# Patient Record
Sex: Male | Born: 1965
Health system: Southern US, Community
[De-identification: ages and names within clinical notes are randomized; demographics above are authoritative.]

## PROBLEM LIST (undated history)

## (undated) DIAGNOSIS — E785 Hyperlipidemia, unspecified: Secondary | ICD-10-CM

## (undated) DIAGNOSIS — F419 Anxiety disorder, unspecified: Secondary | ICD-10-CM

## (undated) DIAGNOSIS — T7840XA Allergy, unspecified, initial encounter: Secondary | ICD-10-CM

## (undated) HISTORY — PX: POLYPECTOMY: SHX149

## (undated) HISTORY — DX: Anxiety disorder, unspecified: F41.9

## (undated) HISTORY — PX: CHOLECYSTECTOMY: SHX55

## (undated) HISTORY — PX: COLONOSCOPY: SHX174

## (undated) HISTORY — DX: Hyperlipidemia, unspecified: E78.5

## (undated) HISTORY — DX: Allergy, unspecified, initial encounter: T78.40XA

---

## 2016-02-11 DIAGNOSIS — K080 Exfoliation of teeth due to systemic causes: Secondary | ICD-10-CM | POA: Diagnosis not present

## 2016-05-15 DIAGNOSIS — Z6829 Body mass index (BMI) 29.0-29.9, adult: Secondary | ICD-10-CM | POA: Diagnosis not present

## 2016-05-15 DIAGNOSIS — F419 Anxiety disorder, unspecified: Secondary | ICD-10-CM | POA: Diagnosis not present

## 2016-05-15 DIAGNOSIS — Z0000 Encounter for general adult medical examination without abnormal findings: Secondary | ICD-10-CM | POA: Diagnosis not present

## 2016-05-15 DIAGNOSIS — E663 Overweight: Secondary | ICD-10-CM | POA: Diagnosis not present

## 2016-06-26 DIAGNOSIS — Z1211 Encounter for screening for malignant neoplasm of colon: Secondary | ICD-10-CM | POA: Diagnosis not present

## 2016-06-26 DIAGNOSIS — Z86010 Personal history of colonic polyps: Secondary | ICD-10-CM | POA: Diagnosis not present

## 2016-06-26 DIAGNOSIS — K580 Irritable bowel syndrome with diarrhea: Secondary | ICD-10-CM | POA: Diagnosis not present

## 2016-06-26 DIAGNOSIS — K760 Fatty (change of) liver, not elsewhere classified: Secondary | ICD-10-CM | POA: Diagnosis not present

## 2016-07-27 DIAGNOSIS — K219 Gastro-esophageal reflux disease without esophagitis: Secondary | ICD-10-CM | POA: Diagnosis not present

## 2016-07-27 DIAGNOSIS — K621 Rectal polyp: Secondary | ICD-10-CM | POA: Diagnosis not present

## 2016-07-27 DIAGNOSIS — Z86010 Personal history of colonic polyps: Secondary | ICD-10-CM | POA: Diagnosis not present

## 2016-07-27 DIAGNOSIS — K648 Other hemorrhoids: Secondary | ICD-10-CM | POA: Diagnosis not present

## 2016-07-27 DIAGNOSIS — K760 Fatty (change of) liver, not elsewhere classified: Secondary | ICD-10-CM | POA: Diagnosis not present

## 2016-07-27 DIAGNOSIS — F419 Anxiety disorder, unspecified: Secondary | ICD-10-CM | POA: Diagnosis not present

## 2016-07-27 DIAGNOSIS — E7800 Pure hypercholesterolemia, unspecified: Secondary | ICD-10-CM | POA: Diagnosis not present

## 2016-07-27 DIAGNOSIS — Z1211 Encounter for screening for malignant neoplasm of colon: Secondary | ICD-10-CM | POA: Diagnosis not present

## 2016-07-27 DIAGNOSIS — Z9049 Acquired absence of other specified parts of digestive tract: Secondary | ICD-10-CM | POA: Diagnosis not present

## 2016-07-27 DIAGNOSIS — D128 Benign neoplasm of rectum: Secondary | ICD-10-CM | POA: Diagnosis not present

## 2016-07-27 DIAGNOSIS — K644 Residual hemorrhoidal skin tags: Secondary | ICD-10-CM | POA: Diagnosis not present

## 2016-07-27 DIAGNOSIS — Z79899 Other long term (current) drug therapy: Secondary | ICD-10-CM | POA: Diagnosis not present

## 2016-07-27 DIAGNOSIS — F17220 Nicotine dependence, chewing tobacco, uncomplicated: Secondary | ICD-10-CM | POA: Diagnosis not present

## 2016-07-27 DIAGNOSIS — K580 Irritable bowel syndrome with diarrhea: Secondary | ICD-10-CM | POA: Diagnosis not present

## 2016-07-27 DIAGNOSIS — Z8371 Family history of colonic polyps: Secondary | ICD-10-CM | POA: Diagnosis not present

## 2016-07-27 DIAGNOSIS — F329 Major depressive disorder, single episode, unspecified: Secondary | ICD-10-CM | POA: Diagnosis not present

## 2016-08-13 DIAGNOSIS — K080 Exfoliation of teeth due to systemic causes: Secondary | ICD-10-CM | POA: Diagnosis not present

## 2016-08-19 DIAGNOSIS — K080 Exfoliation of teeth due to systemic causes: Secondary | ICD-10-CM | POA: Diagnosis not present

## 2017-03-09 DIAGNOSIS — K080 Exfoliation of teeth due to systemic causes: Secondary | ICD-10-CM | POA: Diagnosis not present

## 2017-05-28 DIAGNOSIS — F419 Anxiety disorder, unspecified: Secondary | ICD-10-CM | POA: Diagnosis not present

## 2017-05-28 DIAGNOSIS — Z0000 Encounter for general adult medical examination without abnormal findings: Secondary | ICD-10-CM | POA: Diagnosis not present

## 2017-05-28 DIAGNOSIS — J301 Allergic rhinitis due to pollen: Secondary | ICD-10-CM | POA: Diagnosis not present

## 2017-05-28 DIAGNOSIS — E785 Hyperlipidemia, unspecified: Secondary | ICD-10-CM | POA: Diagnosis not present

## 2017-11-17 DIAGNOSIS — K080 Exfoliation of teeth due to systemic causes: Secondary | ICD-10-CM | POA: Diagnosis not present

## 2018-05-30 DIAGNOSIS — E785 Hyperlipidemia, unspecified: Secondary | ICD-10-CM | POA: Diagnosis not present

## 2018-05-30 DIAGNOSIS — F329 Major depressive disorder, single episode, unspecified: Secondary | ICD-10-CM | POA: Diagnosis not present

## 2018-05-30 DIAGNOSIS — Z0000 Encounter for general adult medical examination without abnormal findings: Secondary | ICD-10-CM | POA: Diagnosis not present

## 2018-05-30 DIAGNOSIS — E538 Deficiency of other specified B group vitamins: Secondary | ICD-10-CM | POA: Diagnosis not present

## 2018-06-15 DIAGNOSIS — K080 Exfoliation of teeth due to systemic causes: Secondary | ICD-10-CM | POA: Diagnosis not present

## 2018-11-16 DIAGNOSIS — J029 Acute pharyngitis, unspecified: Secondary | ICD-10-CM | POA: Diagnosis not present

## 2018-11-16 DIAGNOSIS — J301 Allergic rhinitis due to pollen: Secondary | ICD-10-CM | POA: Diagnosis not present

## 2018-11-16 DIAGNOSIS — J019 Acute sinusitis, unspecified: Secondary | ICD-10-CM | POA: Diagnosis not present

## 2018-11-16 DIAGNOSIS — B349 Viral infection, unspecified: Secondary | ICD-10-CM | POA: Diagnosis not present

## 2018-12-14 DIAGNOSIS — J029 Acute pharyngitis, unspecified: Secondary | ICD-10-CM | POA: Diagnosis not present

## 2018-12-14 DIAGNOSIS — J301 Allergic rhinitis due to pollen: Secondary | ICD-10-CM | POA: Diagnosis not present

## 2018-12-14 DIAGNOSIS — R6889 Other general symptoms and signs: Secondary | ICD-10-CM | POA: Diagnosis not present

## 2018-12-14 DIAGNOSIS — J020 Streptococcal pharyngitis: Secondary | ICD-10-CM | POA: Diagnosis not present

## 2019-07-17 DIAGNOSIS — Z6829 Body mass index (BMI) 29.0-29.9, adult: Secondary | ICD-10-CM | POA: Diagnosis not present

## 2019-07-17 DIAGNOSIS — E785 Hyperlipidemia, unspecified: Secondary | ICD-10-CM | POA: Diagnosis not present

## 2019-07-17 DIAGNOSIS — Z0000 Encounter for general adult medical examination without abnormal findings: Secondary | ICD-10-CM | POA: Diagnosis not present

## 2019-07-17 DIAGNOSIS — F419 Anxiety disorder, unspecified: Secondary | ICD-10-CM | POA: Diagnosis not present

## 2019-07-17 DIAGNOSIS — E559 Vitamin D deficiency, unspecified: Secondary | ICD-10-CM | POA: Diagnosis not present

## 2019-07-17 DIAGNOSIS — Z125 Encounter for screening for malignant neoplasm of prostate: Secondary | ICD-10-CM | POA: Diagnosis not present

## 2019-07-17 DIAGNOSIS — Z23 Encounter for immunization: Secondary | ICD-10-CM | POA: Diagnosis not present

## 2019-07-31 DIAGNOSIS — M5412 Radiculopathy, cervical region: Secondary | ICD-10-CM | POA: Diagnosis not present

## 2019-07-31 DIAGNOSIS — M25511 Pain in right shoulder: Secondary | ICD-10-CM | POA: Diagnosis not present

## 2019-08-08 DIAGNOSIS — M25511 Pain in right shoulder: Secondary | ICD-10-CM | POA: Diagnosis not present

## 2019-08-08 DIAGNOSIS — M6281 Muscle weakness (generalized): Secondary | ICD-10-CM | POA: Diagnosis not present

## 2019-08-08 DIAGNOSIS — M25611 Stiffness of right shoulder, not elsewhere classified: Secondary | ICD-10-CM | POA: Diagnosis not present

## 2019-08-16 DIAGNOSIS — M25511 Pain in right shoulder: Secondary | ICD-10-CM | POA: Diagnosis not present

## 2019-08-16 DIAGNOSIS — M25611 Stiffness of right shoulder, not elsewhere classified: Secondary | ICD-10-CM | POA: Diagnosis not present

## 2019-08-16 DIAGNOSIS — M6281 Muscle weakness (generalized): Secondary | ICD-10-CM | POA: Diagnosis not present

## 2019-08-23 DIAGNOSIS — M25611 Stiffness of right shoulder, not elsewhere classified: Secondary | ICD-10-CM | POA: Diagnosis not present

## 2019-08-23 DIAGNOSIS — M25511 Pain in right shoulder: Secondary | ICD-10-CM | POA: Diagnosis not present

## 2019-08-23 DIAGNOSIS — M6281 Muscle weakness (generalized): Secondary | ICD-10-CM | POA: Diagnosis not present

## 2019-08-24 DIAGNOSIS — K080 Exfoliation of teeth due to systemic causes: Secondary | ICD-10-CM | POA: Diagnosis not present

## 2019-09-05 DIAGNOSIS — M25511 Pain in right shoulder: Secondary | ICD-10-CM | POA: Diagnosis not present

## 2019-09-08 DIAGNOSIS — M19011 Primary osteoarthritis, right shoulder: Secondary | ICD-10-CM | POA: Diagnosis not present

## 2019-09-08 DIAGNOSIS — M75111 Incomplete rotator cuff tear or rupture of right shoulder, not specified as traumatic: Secondary | ICD-10-CM | POA: Diagnosis not present

## 2019-11-10 DIAGNOSIS — J069 Acute upper respiratory infection, unspecified: Secondary | ICD-10-CM | POA: Diagnosis not present

## 2019-11-10 DIAGNOSIS — B349 Viral infection, unspecified: Secondary | ICD-10-CM | POA: Diagnosis not present

## 2020-09-04 DIAGNOSIS — Z1331 Encounter for screening for depression: Secondary | ICD-10-CM | POA: Diagnosis not present

## 2020-09-04 DIAGNOSIS — Z125 Encounter for screening for malignant neoplasm of prostate: Secondary | ICD-10-CM | POA: Diagnosis not present

## 2020-09-04 DIAGNOSIS — Z0000 Encounter for general adult medical examination without abnormal findings: Secondary | ICD-10-CM | POA: Diagnosis not present

## 2020-09-04 DIAGNOSIS — Z6829 Body mass index (BMI) 29.0-29.9, adult: Secondary | ICD-10-CM | POA: Diagnosis not present

## 2020-09-04 DIAGNOSIS — E559 Vitamin D deficiency, unspecified: Secondary | ICD-10-CM | POA: Diagnosis not present

## 2020-09-04 DIAGNOSIS — E785 Hyperlipidemia, unspecified: Secondary | ICD-10-CM | POA: Diagnosis not present

## 2020-10-04 DIAGNOSIS — B349 Viral infection, unspecified: Secondary | ICD-10-CM | POA: Diagnosis not present

## 2020-10-04 DIAGNOSIS — J029 Acute pharyngitis, unspecified: Secondary | ICD-10-CM | POA: Diagnosis not present

## 2020-10-04 DIAGNOSIS — J069 Acute upper respiratory infection, unspecified: Secondary | ICD-10-CM | POA: Diagnosis not present

## 2020-10-11 DIAGNOSIS — R0982 Postnasal drip: Secondary | ICD-10-CM | POA: Diagnosis not present

## 2020-10-11 DIAGNOSIS — J019 Acute sinusitis, unspecified: Secondary | ICD-10-CM | POA: Diagnosis not present

## 2021-02-27 DIAGNOSIS — R051 Acute cough: Secondary | ICD-10-CM | POA: Diagnosis not present

## 2021-02-27 DIAGNOSIS — R5381 Other malaise: Secondary | ICD-10-CM | POA: Diagnosis not present

## 2021-02-27 DIAGNOSIS — Z20828 Contact with and (suspected) exposure to other viral communicable diseases: Secondary | ICD-10-CM | POA: Diagnosis not present

## 2021-02-27 DIAGNOSIS — M791 Myalgia, unspecified site: Secondary | ICD-10-CM | POA: Diagnosis not present

## 2021-02-27 DIAGNOSIS — Z1159 Encounter for screening for other viral diseases: Secondary | ICD-10-CM | POA: Diagnosis not present

## 2021-02-27 DIAGNOSIS — R509 Fever, unspecified: Secondary | ICD-10-CM | POA: Diagnosis not present

## 2021-07-28 ENCOUNTER — Encounter: Payer: Self-pay | Admitting: Gastroenterology

## 2021-10-02 ENCOUNTER — Other Ambulatory Visit: Payer: Self-pay

## 2021-10-02 ENCOUNTER — Ambulatory Visit (AMBULATORY_SURGERY_CENTER): Payer: Self-pay | Admitting: *Deleted

## 2021-10-02 VITALS — Ht 67.0 in | Wt 180.0 lb

## 2021-10-02 DIAGNOSIS — Z86010 Personal history of colonic polyps: Secondary | ICD-10-CM

## 2021-10-02 DIAGNOSIS — Z8371 Family history of colonic polyps: Secondary | ICD-10-CM

## 2021-10-02 NOTE — Progress Notes (Signed)
No egg or soy allergy known to patient  No issues known to pt with past sedation with any surgeries or procedures- hard to wake post op  Patient denies ever being told they had issues or difficulty with intubation  No FH of Malignant Hyperthermia Pt is not on diet pills Pt is not on  home 02  Pt is not on blood thinners  Pt denies issues with constipation  No A fib or A flutter  Pt is fully vaccinated  for Covid  NO PA's for preps discussed with pt In PV today  Discussed with pt there will be an out-of-pocket cost for prep and that varies from $0 to 70 +  dollars - pt verbalized understanding   Due to the COVID-19 pandemic we are asking patients to follow certain guidelines in PV and the [facility]   Pt aware of COVID protocols and [facility] guidelines   PV completed over the phone. Pt verified name, DOB, address and insurance during PV today.  Pt mailed instruction packet with copy of consent form to read and not return, and instructions.   Pt encouraged to call with questions or issues.  If pt has My chart, procedure instructions sent via My Chart  Emailed instructions to [email or web address]

## 2021-10-04 DIAGNOSIS — Z20822 Contact with and (suspected) exposure to covid-19: Secondary | ICD-10-CM | POA: Diagnosis not present

## 2021-10-04 DIAGNOSIS — R531 Weakness: Secondary | ICD-10-CM | POA: Diagnosis not present

## 2021-10-04 DIAGNOSIS — R55 Syncope and collapse: Secondary | ICD-10-CM | POA: Diagnosis not present

## 2021-10-06 DIAGNOSIS — R001 Bradycardia, unspecified: Secondary | ICD-10-CM | POA: Diagnosis not present

## 2021-10-08 DIAGNOSIS — E559 Vitamin D deficiency, unspecified: Secondary | ICD-10-CM | POA: Diagnosis not present

## 2021-10-08 DIAGNOSIS — Z6828 Body mass index (BMI) 28.0-28.9, adult: Secondary | ICD-10-CM | POA: Diagnosis not present

## 2021-10-08 DIAGNOSIS — E538 Deficiency of other specified B group vitamins: Secondary | ICD-10-CM | POA: Diagnosis not present

## 2021-10-08 DIAGNOSIS — Z0000 Encounter for general adult medical examination without abnormal findings: Secondary | ICD-10-CM | POA: Diagnosis not present

## 2021-10-16 ENCOUNTER — Ambulatory Visit (AMBULATORY_SURGERY_CENTER): Payer: Federal, State, Local not specified - PPO | Admitting: Gastroenterology

## 2021-10-16 ENCOUNTER — Other Ambulatory Visit: Payer: Self-pay

## 2021-10-16 ENCOUNTER — Encounter: Payer: Self-pay | Admitting: Gastroenterology

## 2021-10-16 VITALS — BP 112/79 | HR 63 | Temp 97.5°F | Resp 14 | Ht 67.0 in | Wt 180.0 lb

## 2021-10-16 DIAGNOSIS — Z86010 Personal history of colonic polyps: Secondary | ICD-10-CM | POA: Diagnosis not present

## 2021-10-16 DIAGNOSIS — D123 Benign neoplasm of transverse colon: Secondary | ICD-10-CM

## 2021-10-16 DIAGNOSIS — Z8371 Family history of colonic polyps: Secondary | ICD-10-CM

## 2021-10-16 MED ORDER — SODIUM CHLORIDE 0.9 % IV SOLN: 500.0000 mL | Freq: Once | INTRAVENOUS | Status: DC

## 2021-10-16 NOTE — Progress Notes (Signed)
Report to PACU, RN, vss, BBS= Clear.  

## 2021-10-16 NOTE — Op Note (Signed)
[facility] Patient Name: Shelia Magallon Procedure Date: 10/16/2021 8:40 AM MRN: [id number] Endoscopist: Jackquline Denmark , MD Age: 55 Referring MD:  Date of Birth: [birth year] Gender: Male Account #: [id number] Procedure:                Colonoscopy Indications:              High risk colon cancer surveillance: Personal                            history of colonic polyps. FH of colon polyps (mom) Medicines:                Monitored Anesthesia Care Procedure:                Pre-Anesthesia Assessment:                           - Prior to the procedure, a History and Physical                            was performed, and patient medications and                            allergies were reviewed. The patient's tolerance of                            previous anesthesia was also reviewed. The risks                            and benefits of the procedure and the sedation                            options and risks were discussed with the patient.                            All questions were answered, and informed consent                            was obtained. Prior Anticoagulants: The patient has                            taken no previous anticoagulant or antiplatelet                            agents. ASA Grade Assessment: II - A patient with                            mild systemic disease. After reviewing the risks                            and benefits, the patient was deemed in                            satisfactory condition to undergo the procedure.  After obtaining informed consent, the colonoscope                            was passed under direct vision. Throughout the                            procedure, the patient's blood pressure, pulse, and                            oxygen saturations were monitored continuously. The                            Olympus CF-HQ190L ([id number]) Colonoscope was                            introduced through the  anus and advanced to the 2                            cm into the ileum. The colonoscopy was performed                            without difficulty. The patient tolerated the                            procedure well. The quality of the bowel                            preparation was good. The terminal ileum, ileocecal                            valve, appendiceal orifice, and rectum were                            photographed. Scope In: 8:52:16 AM Scope Out: 9:08:34 AM Scope Withdrawal Time: 0 hours 12 minutes 25 seconds  Total Procedure Duration: 0 hours 16 minutes 18 seconds  Findings:                 Two sessile polyps were found in the proximal                            transverse colon and mid transverse colon. The                            polyps were 6 to 8 mm in size. These polyps were                            removed with a cold snare. Resection and retrieval                            were complete.                           Non-bleeding external and internal hemorrhoids were  found during retroflexion and during perianal exam.                            The hemorrhoids were small and Grade I (internal                            hemorrhoids that do not prolapse).                           The terminal ileum appeared normal.                           The exam was otherwise without abnormality on                            direct and retroflexion views.                           A few small-mouthed diverticula were found in the                            sigmoid colon, rare in ascending colon and cecum.                            The colon was highly redundant. Complications:            No immediate complications. Estimated Blood Loss:     Estimated blood loss: none. Estimated blood loss:                            none. Impression:               - Two 6 to 8 mm polyps in the proximal transverse                            colon and in the mid  transverse colon, removed with                            a cold snare. Resected and retrieved.                           - Non-bleeding external and internal hemorrhoids.                           - Mild pancolonic diverticulosis.                           - The examined portion of the ileum was normal.                           - The examination was otherwise normal on direct                            and retroflexion views. Recommendation:           - Patient has a contact  number available for                            emergencies. The signs and symptoms of potential                            delayed complications were discussed with the                            patient. Return to normal activities tomorrow.                            Written discharge instructions were provided to the                            patient.                           - Resume high-fiber diet.                           - Continue present medications.                           - Await pathology results.                           - Repeat colonoscopy for surveillance based on                            pathology results.                           - If any anorectal problems, use Preparation H 1                            twice daily after the bowel movement for 10 days                            and need to get in touch with [place]. If persistent                            anorectal problems in future, I would have low                            threshold in referring him for surgical opinion for                            EUA/hemorrhoidectomy. Of note that he is not having                            any problems currently.                           - The findings and recommendations were discussed  with the patient's family. Jackquline Denmark, MD 10/16/2021 9:14:49 AM This report has been signed electronically.

## 2021-10-16 NOTE — Progress Notes (Signed)
Called to room to assist during endoscopic procedure.  Patient ID and intended procedure confirmed with present staff. Received instructions for my participation in the procedure from the performing physician.  

## 2021-10-16 NOTE — Patient Instructions (Signed)
Please read handouts provided. Continue present medications. High Fiber Diet. Await pathology results. Use Preparation H twice daily after bowel movement for 10 days if having anorectal problems, and get back in touch with Dr. Lyndel Safe.   YOU HAD AN ENDOSCOPIC PROCEDURE TODAY AT [facility]:   Refer to the procedure report that was given to you for any specific questions about what was found during the examination.  If the procedure report does not answer your questions, please call your gastroenterologist to clarify.  If you requested that your care partner not be given the details of your procedure findings, then the procedure report has been included in a sealed envelope for you to review at your convenience later.  YOU SHOULD EXPECT: Some feelings of bloating in the abdomen. Passage of more gas than usual.  Walking can help get rid of the air that was put into your GI tract during the procedure and reduce the bloating. If you had a lower endoscopy (such as a colonoscopy or flexible sigmoidoscopy) you may notice spotting of blood in your stool or on the toilet paper. If you underwent a bowel prep for your procedure, you may not have a normal bowel movement for a few days.  Please Note:  You might notice some irritation and congestion in your nose or some drainage.  This is from the oxygen used during your procedure.  There is no need for concern and it should clear up in a day or so.  SYMPTOMS TO REPORT IMMEDIATELY:  Following lower endoscopy (colonoscopy or flexible sigmoidoscopy):  Excessive amounts of blood in the stool  Significant tenderness or worsening of abdominal pains  Swelling of the abdomen that is new, acute  Fever of 100F or higher    For urgent or emergent issues, a gastroenterologist can be reached at any hour by calling [phone]. Do not use MyChart messaging for urgent concerns.    DIET:  We do recommend a small meal at first, but then you may  proceed to your regular diet.  Drink plenty of fluids but you should avoid alcoholic beverages for 24 hours.  ACTIVITY:  You should plan to take it easy for the rest of today and you should NOT DRIVE or use heavy machinery until tomorrow (because of the sedation medicines used during the test).    FOLLOW UP: Our staff will call the number listed on your records 48-72 hours following your procedure to check on you and address any questions or concerns that you may have regarding the information given to you following your procedure. If we do not reach you, we will leave a message.  We will attempt to reach you two times.  During this call, we will ask if you have developed any symptoms of COVID 19. If you develop any symptoms (ie: fever, flu-like symptoms, shortness of breath, cough etc.) before then, please call [phone].  If you test positive for Covid 19 in the 2 weeks post procedure, please call and report this information to [place].    If any biopsies were taken you will be contacted by phone or by letter within the next 1-3 weeks.  Please call us at [phone] if you have not heard about the biopsies in 3 weeks.    SIGNATURES/CONFIDENTIALITY: You and/or your care partner have signed paperwork which will be entered into your electronic medical record.  These signatures attest to the fact that that the information above on your After Visit Summary has been  reviewed and is understood.  Full responsibility of the confidentiality of this discharge information lies with you and/or your care-partner.

## 2021-10-16 NOTE — Progress Notes (Signed)
VS- Alex Chapman  Pt's states no medical or surgical changes since previsit or office visit.  

## 2021-10-20 ENCOUNTER — Telehealth: Payer: Self-pay

## 2021-10-20 NOTE — Telephone Encounter (Signed)
°  Follow up Call-  Call back number 10/16/2021 10/16/2021  Post procedure Call Back phone  # [phone]  Permission to leave phone message Yes Yes  Some recent data might be hidden     Patient questions:  Do you have a fever, pain , or abdominal swelling? No. Pain Score  0 *  Have you tolerated food without any problems? Yes.    Have you been able to return to your normal activities? Yes.    Do you have any questions about your discharge instructions: Diet   No. Medications  No. Follow up visit  No.  Do you have questions or concerns about your Care? No.  Actions: * If pain score is 4 or above: No action needed, pain <4.

## 2021-10-21 ENCOUNTER — Encounter: Payer: Self-pay | Admitting: Gastroenterology

## 2021-10-29 DIAGNOSIS — M4726 Other spondylosis with radiculopathy, lumbar region: Secondary | ICD-10-CM | POA: Diagnosis not present

## 2021-10-29 DIAGNOSIS — M5416 Radiculopathy, lumbar region: Secondary | ICD-10-CM | POA: Diagnosis not present

## 2021-10-29 DIAGNOSIS — M4316 Spondylolisthesis, lumbar region: Secondary | ICD-10-CM | POA: Diagnosis not present

## 2021-10-29 DIAGNOSIS — Z79899 Other long term (current) drug therapy: Secondary | ICD-10-CM | POA: Diagnosis not present

## 2021-10-29 DIAGNOSIS — M48061 Spinal stenosis, lumbar region without neurogenic claudication: Secondary | ICD-10-CM | POA: Diagnosis not present

## 2021-10-29 DIAGNOSIS — M545 Low back pain, unspecified: Secondary | ICD-10-CM | POA: Diagnosis not present

## 2021-12-11 DIAGNOSIS — Z79899 Other long term (current) drug therapy: Secondary | ICD-10-CM | POA: Diagnosis not present

## 2021-12-11 DIAGNOSIS — E559 Vitamin D deficiency, unspecified: Secondary | ICD-10-CM | POA: Diagnosis not present

## 2021-12-11 DIAGNOSIS — E538 Deficiency of other specified B group vitamins: Secondary | ICD-10-CM | POA: Diagnosis not present

## 2021-12-11 DIAGNOSIS — E785 Hyperlipidemia, unspecified: Secondary | ICD-10-CM | POA: Diagnosis not present

## 2022-02-25 ENCOUNTER — Encounter: Payer: Self-pay | Admitting: Gastroenterology

## 2022-02-25 ENCOUNTER — Other Ambulatory Visit (INDEPENDENT_AMBULATORY_CARE_PROVIDER_SITE_OTHER): Payer: Federal, State, Local not specified - PPO

## 2022-02-25 ENCOUNTER — Ambulatory Visit: Payer: Federal, State, Local not specified - PPO | Admitting: Gastroenterology

## 2022-02-25 VITALS — BP 144/90 | HR 77 | Ht 66.0 in | Wt 179.4 lb

## 2022-02-25 DIAGNOSIS — R1084 Generalized abdominal pain: Secondary | ICD-10-CM | POA: Diagnosis not present

## 2022-02-25 DIAGNOSIS — R1031 Right lower quadrant pain: Secondary | ICD-10-CM

## 2022-02-25 DIAGNOSIS — R1011 Right upper quadrant pain: Secondary | ICD-10-CM

## 2022-02-25 LAB — BASIC METABOLIC PANEL
BUN: 13 mg/dL (ref 6–23)
CO2: 30 mEq/L (ref 19–32)
Calcium: 10 mg/dL (ref 8.4–10.5)
Chloride: 103 mEq/L (ref 96–112)
Creatinine, Ser: 0.87 mg/dL (ref 0.40–1.50)
GFR: 97.74 mL/min (ref >60.00)
Glucose, Bld: 103 mg/dL — ABNORMAL HIGH (ref 70–99)
Potassium: 4.5 mEq/L (ref 3.5–5.1)
Sodium: 140 mEq/L (ref 135–145)

## 2022-02-25 NOTE — Patient Instructions (Signed)
Your provider has requested that you go to the basement level for lab work before leaving today. Press "B" on the elevator. The lab is located at the first door on the left as you exit the elevator. ? ?You have been scheduled for a CT scan of the abdomen and pelvis at [facility] ([address]---this is in the same building as [employer]).  ? ?You are scheduled on Friday 03/06/22 at 8:30 am. You should arrive 15 minutes prior to your appointment time for registration. Please follow the written instructions below on the day of your exam: ? ?WARNING: IF YOU ARE ALLERGIC TO IODINE/X-RAY DYE, PLEASE NOTIFY RADIOLOGY IMMEDIATELY AT [phone]! YOU WILL BE GIVEN A 13 HOUR PREMEDICATION PREP. ? ?1) Do not eat or drink anything after 4:30 am (4 hours prior to your test) ?2) You have been given 2 bottles of oral contrast to drink. The solution may taste better if refrigerated, but do NOT add ice or any other liquid to this solution. Shake well before drinking. ?  ? Drink 1 bottle of contrast @ 6:30 am (2 hours prior to your exam) ? Drink 1 bottle of contrast @ 7:30 am (1 hour prior to your exam) ? ?You may take any medications as prescribed with a small amount of water, if necessary. If you take any of the following medications: METFORMIN, GLUCOPHAGE, GLUCOVANCE, AVANDAMET, RIOMET, FORTAMET, ACTOPLUS MET, JANUMET, [place] or METAGLIP, you MAY be asked to HOLD this medication 48 hours AFTER the exam. ? ?The purpose of you drinking the oral contrast is to aid in the visualization of your intestinal tract. The contrast solution may cause some diarrhea. Depending on your individual set of symptoms, you may also receive an intravenous injection of x-ray contrast/dye. Plan on being at [facility] for 30 minutes or longer, depending on the type of exam you are having performed. ? ?This test typically takes 30-45 minutes to complete. ? ?If you have any questions regarding your exam or if you  need to reschedule, you may call the CT department at [phone] between the hours of 8:00 am and 5:00 pm, Monday-Friday. ? ?If you are age 87 or younger, your body mass index should be between 19-25. Your Body mass index is 28.96 kg/m?Marland Kitchen If this is out of the aformentioned range listed, please consider follow up with your Primary Care Provider.  ? ?________________________________________________________ ? ?The [facility] providers would like to encourage you to use [facility] to communicate with providers for non-urgent requests or questions.  Due to long hold times on the telephone, sending your provider a message by [facility] may be a faster and more efficient way to get a response.  Please allow 48 business hours for a response.  Please remember that this is for non-urgent requests.  ?_______________________________________________________ ? ? ?___________________________________________________________________ ?

## 2022-02-25 NOTE — Progress Notes (Signed)
? ? ? ?02/25/2022 ?HARDIN HARDENBROOK ?[id number] ?May 02, 1966 ? ? ?HISTORY OF PRESENT ILLNESS:  This is a 56 year old male with limited PMH who is a patient of Dr. Steve Rattler, known to him only for colonoscopy in 10/2021.  He presents to our office today with complaints of right-sided abdominal pain, both in right upper quadrant, right lower quadrant/right flank and somewhat radiating to the right side of his back for the past 4 to 5 months.  Only other GI symptom is some bloating but that is not new, has been present intermittently for a long time.  No known injury.  Pain does not wake him at night and doesnot seem to occur/worsen after eating.  No heartburn/reflux but does have some belching.  He is status post cholecystectomy 20 years ago.  He admits to a lot of anxiety with a lot of stress in his life over the past months.  Things have calmed down some recently and his pain has decreased but still present and just a nagging type pain.   ? ? ?Past Medical History:  ?Diagnosis Date  ? Allergy   ? seasonal  ? Anxiety   ? Hyperlipidemia   ? ?Past Surgical History:  ?Procedure Laterality Date  ? CHOLECYSTECTOMY    ? 20  yrs ago  ? COLONOSCOPY    ? POLYPECTOMY    ? ? reports that he has been smoking cigars. He has quit using smokeless tobacco. He reports that he does not currently use alcohol. He reports that he does not use drugs. ?family history includes Colon polyps in his mother. ?No Known Allergies ? ?  ?Outpatient Encounter Medications as of 02/25/2022  ?Medication Sig  ? fluticasone (FLONASE) 50 MCG/ACT nasal spray Place into both nostrils as needed for allergies or rhinitis.  ? PARoxetine (PAXIL-CR) 12.5 MG 24 hr tablet Take 12.5 mg by mouth every other day.  ? pravastatin (PRAVACHOL) 80 MG tablet Take 80 mg by mouth daily.  ? Probiotic Product (ALIGN PO) Take by mouth.  ? Vitamin D, Ergocalciferol, (DRISDOL) 1.25 MG (50000 UNIT) CAPS capsule TAKE 1 CAPSULE ONCE A WEEK  ? [DISCONTINUED] rosuvastatin (CRESTOR) 20 MG  tablet Take 20 mg by mouth daily.  ? ?No facility-administered encounter medications on file as of 02/25/2022.  ? ? ?REVIEW OF SYSTEMS  : All other systems reviewed and negative except where noted in the History of Present Illness. ? ? ?PHYSICAL EXAM: ?BP (!) 144/90   Pulse 77   Ht '5\' 6"'$  (1.676 m)   Wt 179 lb 6.4 oz (81.4 kg)   SpO2 97%   BMI 28.96 kg/m?  ?General: Well developed white male in no acute distress ?Head: Normocephalic and atraumatic ?Eyes:  Sclerae anicteric, conjunctiva pink. ?Ears: Normal auditory acuity ?Lungs: Clear throughout to auscultation; no W/R/R. ?Heart: Regular rate and rhythm; no M/R/G. ?Abdomen: Soft, non-distended.  BS present.  Non-tender. ?Musculoskeletal: Symmetrical with no gross deformities  ?Skin: No lesions on visible extremities ?Extremities: No edema  ?Neurological: Alert oriented x 4, grossly non-focal ?Psychological:  Alert and cooperative. Normal mood and affect ? ?ASSESSMENT AND PLAN: ?*56 year old male with complaints of right-sided abdominal pain, both in right upper quadrant, right lower quadrant/right flank and somewhat radiating to the right side of his back for the past 4 to 5 months.  Only other GI symptom is some bloating but that is not new, has been present intermittently for a long time.  No known injury.  He is status post cholecystectomy 20 years ago.  He  admits to a lot of anxiety with a lot of stress in his life over the past months.  Things have calmed down some recently and his pain has decreased but still present and just a nagging type pain.  I think that doing some imaging will allow some reassurance.  Will plan for CT scan of the abdomen and pelvis with contrast.  Basic labs in December all looked good at that time. ? ? ?CC:  [facility]* ? ?  ?

## 2022-03-06 ENCOUNTER — Ambulatory Visit (INDEPENDENT_AMBULATORY_CARE_PROVIDER_SITE_OTHER)
Admission: RE | Admit: 2022-03-06 | Discharge: 2022-03-06 | Disposition: A | Discharge status: Home / Self Care | Payer: Federal, State, Local not specified - PPO | Source: Ambulatory Visit | Attending: Gastroenterology | Admitting: Gastroenterology

## 2022-03-06 DIAGNOSIS — R1084 Generalized abdominal pain: Secondary | ICD-10-CM | POA: Diagnosis not present

## 2022-03-06 DIAGNOSIS — R1031 Right lower quadrant pain: Secondary | ICD-10-CM | POA: Diagnosis not present

## 2022-03-06 DIAGNOSIS — I700 Atherosclerosis of aorta: Secondary | ICD-10-CM | POA: Diagnosis not present

## 2022-03-06 MED ORDER — IOHEXOL 300 MG/ML  SOLN
100.0000 mL | Freq: Once | INTRAMUSCULAR | Status: AC | PRN
  Administered 2022-03-06: 100 mL via INTRAVENOUS

## 2022-03-15 NOTE — Progress Notes (Signed)
Agree with assessment/plan RG 

## 2022-04-08 DIAGNOSIS — Z79899 Other long term (current) drug therapy: Secondary | ICD-10-CM | POA: Diagnosis not present

## 2022-04-08 DIAGNOSIS — Z6829 Body mass index (BMI) 29.0-29.9, adult: Secondary | ICD-10-CM | POA: Diagnosis not present

## 2022-04-08 DIAGNOSIS — E559 Vitamin D deficiency, unspecified: Secondary | ICD-10-CM | POA: Diagnosis not present

## 2022-04-08 DIAGNOSIS — E538 Deficiency of other specified B group vitamins: Secondary | ICD-10-CM | POA: Diagnosis not present

## 2022-04-08 DIAGNOSIS — E785 Hyperlipidemia, unspecified: Secondary | ICD-10-CM | POA: Diagnosis not present

## 2022-10-13 DIAGNOSIS — Z6829 Body mass index (BMI) 29.0-29.9, adult: Secondary | ICD-10-CM | POA: Diagnosis not present

## 2022-10-13 DIAGNOSIS — E538 Deficiency of other specified B group vitamins: Secondary | ICD-10-CM | POA: Diagnosis not present

## 2022-10-13 DIAGNOSIS — E559 Vitamin D deficiency, unspecified: Secondary | ICD-10-CM | POA: Diagnosis not present

## 2022-10-13 DIAGNOSIS — Z0000 Encounter for general adult medical examination without abnormal findings: Secondary | ICD-10-CM | POA: Diagnosis not present

## 2023-04-01 IMAGING — CT CT ABD-PELV W/ CM
2 of 5 series · 16 of 46 positions shown, 18 images · IV contrast (OMNIPAQUE 300)
Comparison: CT abdomen and pelvis 07/25/2014

CLINICAL DATA: Right lower quadrant pain

EXAM:
CT ABDOMEN AND PELVIS WITH CONTRAST
TECHNIQUE: Multidetector CT imaging of the abdomen and pelvis was performed
using the standard protocol following bolus administration of
intravenous contrast.

[Series 2: abd/pel w · axial · 0.74mm/px · z∈[-458,-28]mm · 13 of 98 slices shown, 15 images]
[im 6/98  soft-tissue]
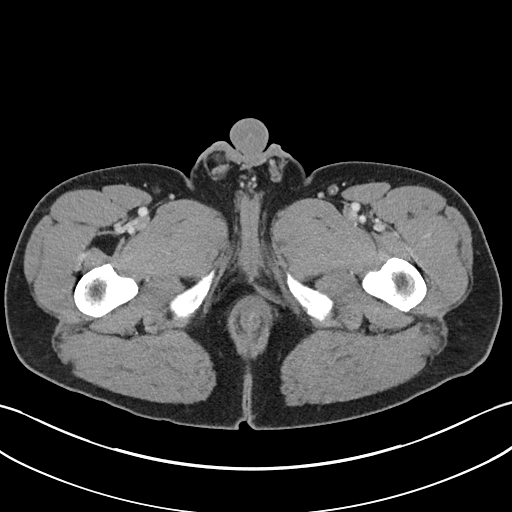
[im 6/98  bone]
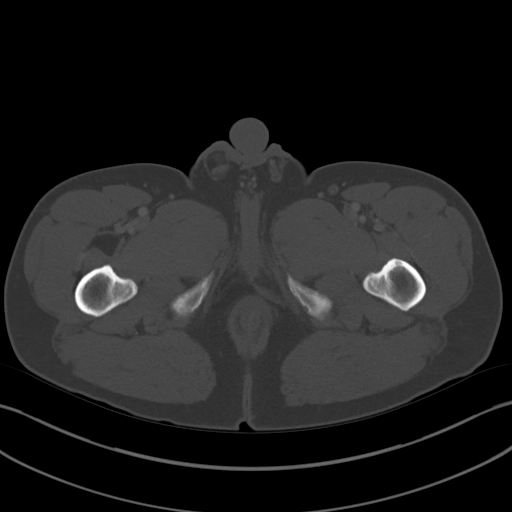
[im 11/98  soft-tissue]
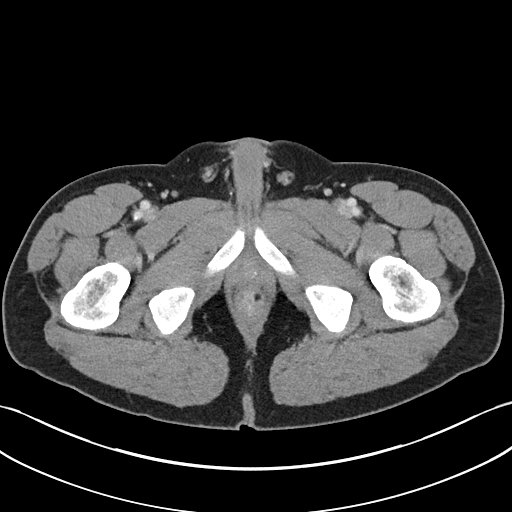
[im 22/98  soft-tissue]
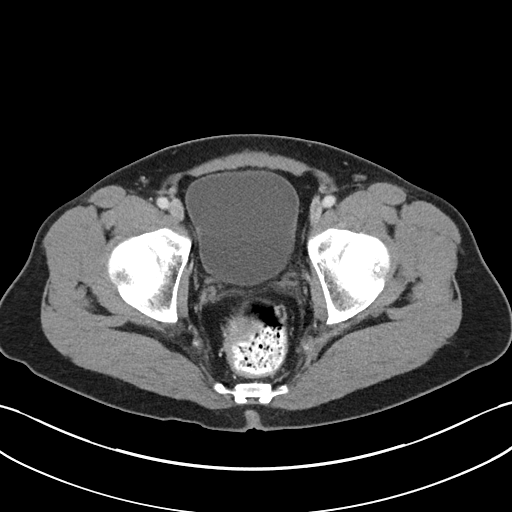
[im 27/98  soft-tissue]
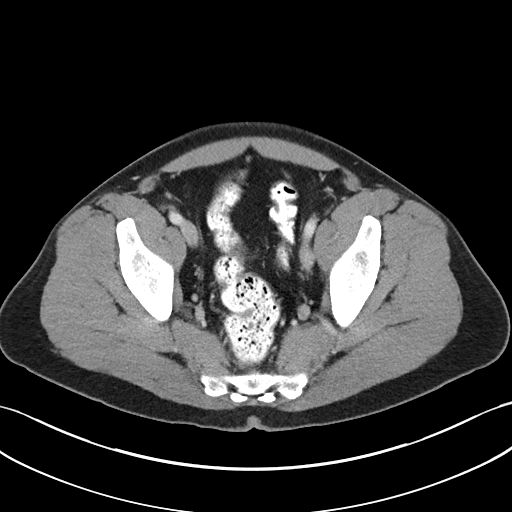
[im 33/98  soft-tissue]
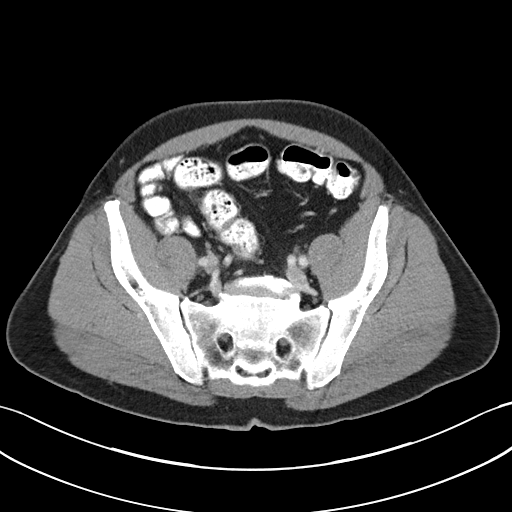
[im 44/98  soft-tissue]
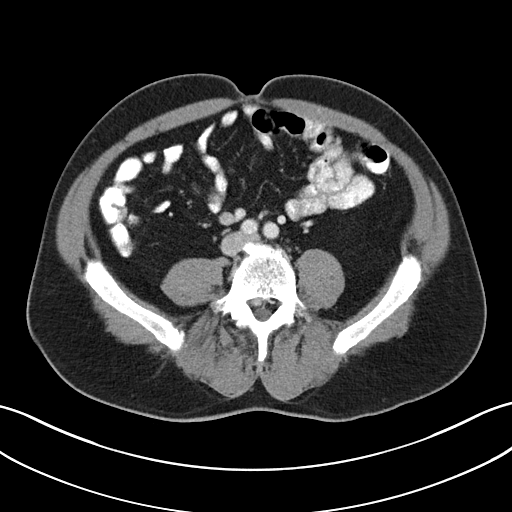
[im 49/98  soft-tissue]
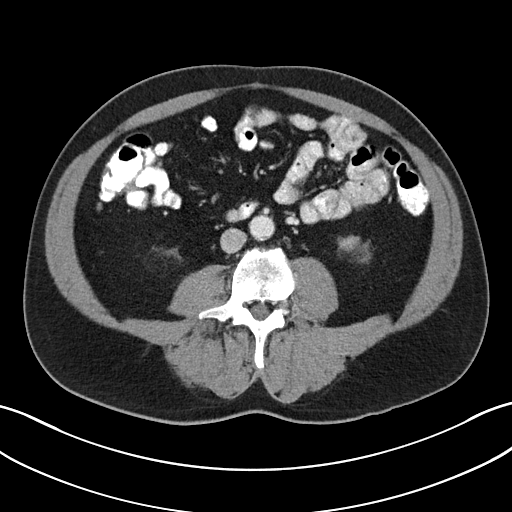
[im 54/98  soft-tissue]
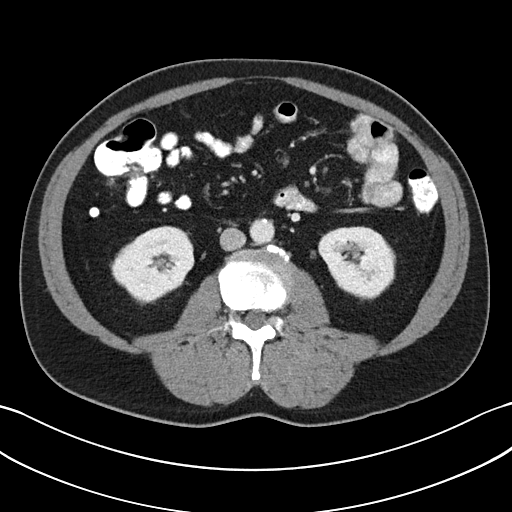
[im 65/98  soft-tissue]
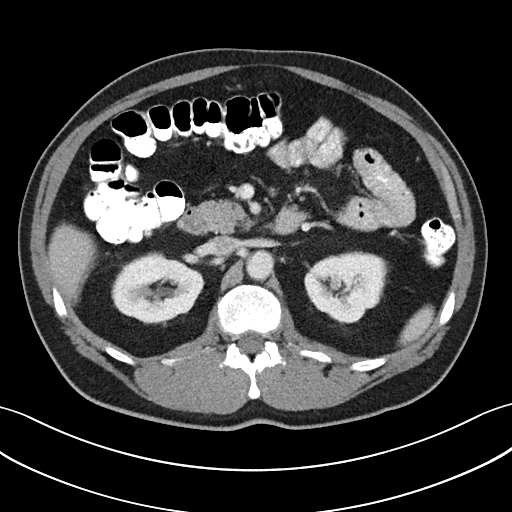
[im 65/98  bone]
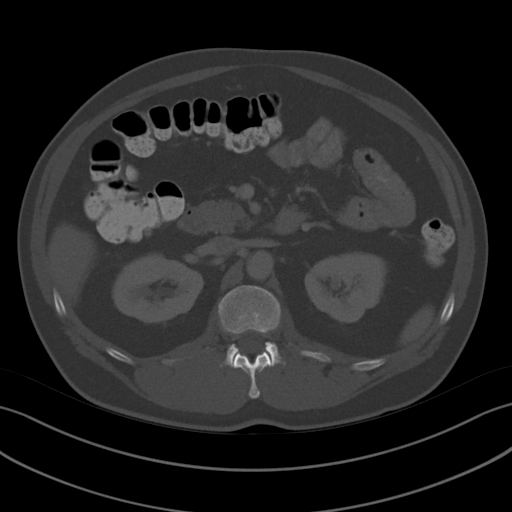
[im 71/98  soft-tissue]
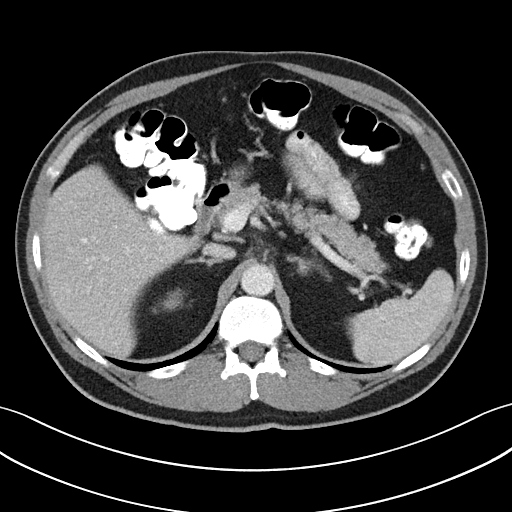
[im 76/98  soft-tissue]
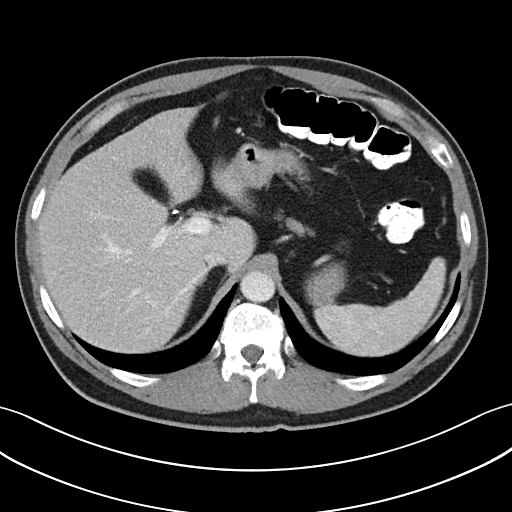
[im 87/98  soft-tissue]
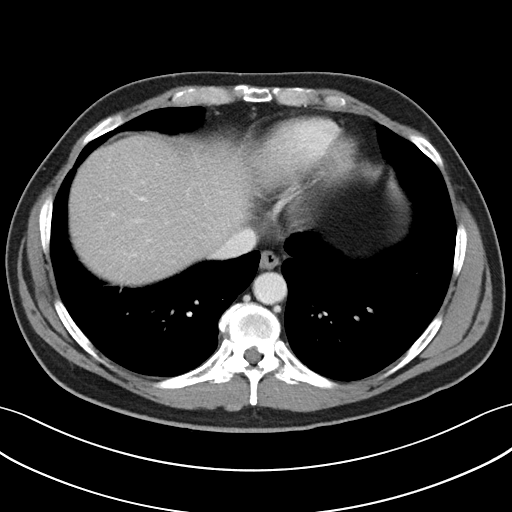
[im 92/98  soft-tissue]
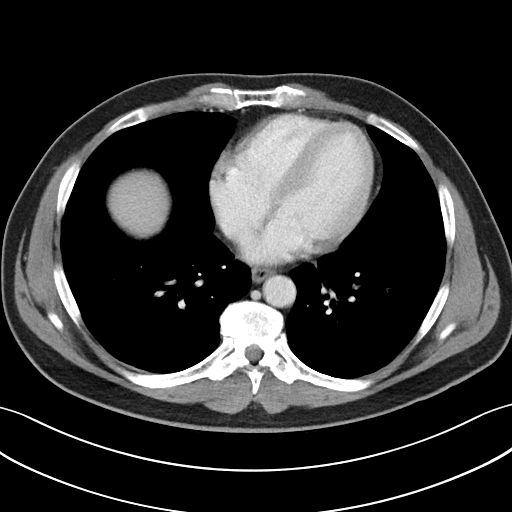

[Series 5: coronal st · coronal · 0.69mm/px · 3 of 90 slices shown]
[im 30/90  soft-tissue]
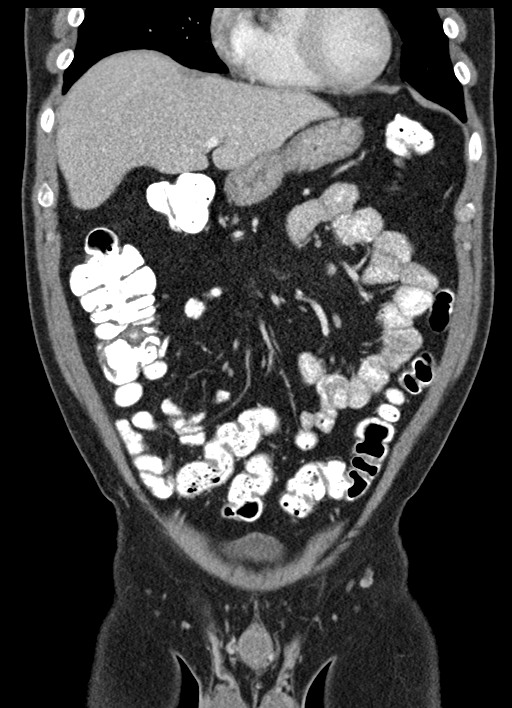
[im 40/90  soft-tissue]
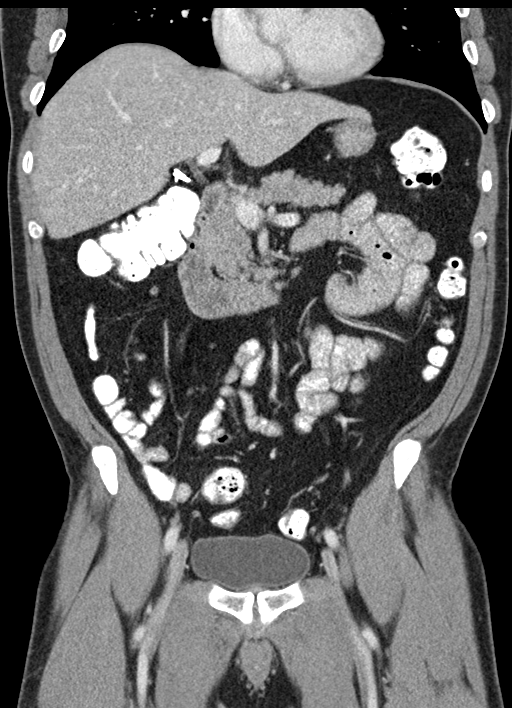
[im 50/90  soft-tissue]
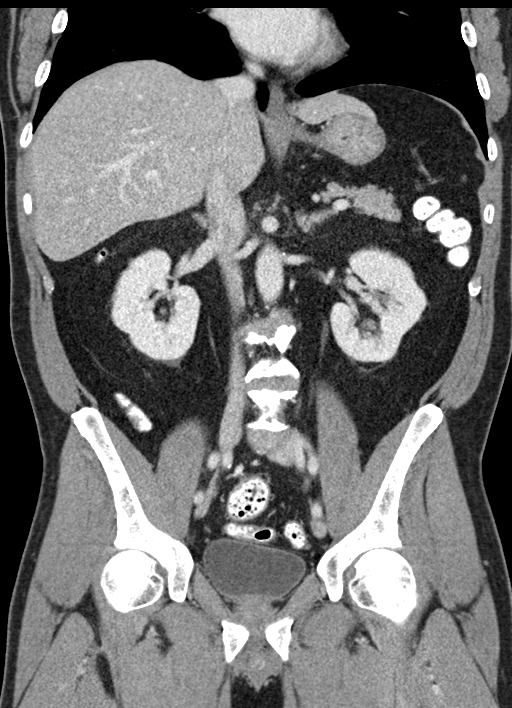

[16 of 46 positions shown; findings below may reference images not displayed]

RADIATION DOSE REDUCTION: This exam was performed according to the
departmental dose-optimization program which includes automated
exposure control, adjustment of the mA and/or kV according to
patient size and/or use of iterative reconstruction technique.

CONTRAST:  100mL OMNIPAQUE IOHEXOL 300 MG/ML  SOLN
FINDINGS: Lower chest: No acute abnormality.

Hepatobiliary: Liver is normal in size and contour. Subcentimeter
hypodensity in the mid right hepatic lobe which is too small to
characterize but most likely a cyst or hemangioma. Gallbladder is
surgically absent. No biliary ductal dilatation identified.

Pancreas: Unremarkable. No pancreatic ductal dilatation or
surrounding inflammatory changes.

Spleen: Normal in size without focal abnormality.

Adrenals/Urinary Tract: Adrenal glands are unremarkable. Kidneys are
normal, without renal calculi, focal lesion, or hydronephrosis.
Bladder is unremarkable.

Stomach/Bowel: No bowel obstruction, free air or pneumatosis. No
bowel wall edema identified. Appendix is normal.

Vascular/Lymphatic: Aortic atherosclerosis. No enlarged abdominal or
pelvic lymph nodes.

Reproductive: Prostate is unremarkable.

Other: No ascites.

Musculoskeletal: Degenerative changes in the lumbar spine. Bilateral
spondylolysis of L5 with grade 1 anterolisthesis of L5 on S1.
IMPRESSION: 1. No acute process identified.
2. Subcentimeter hypodensity in the right hepatic lobe which is too
small to characterize, most likely a cyst or hemangioma.

## 2023-04-13 DIAGNOSIS — Z6829 Body mass index (BMI) 29.0-29.9, adult: Secondary | ICD-10-CM | POA: Diagnosis not present

## 2023-04-13 DIAGNOSIS — Z79899 Other long term (current) drug therapy: Secondary | ICD-10-CM | POA: Diagnosis not present

## 2023-04-13 DIAGNOSIS — E785 Hyperlipidemia, unspecified: Secondary | ICD-10-CM | POA: Diagnosis not present

## 2023-04-13 DIAGNOSIS — E559 Vitamin D deficiency, unspecified: Secondary | ICD-10-CM | POA: Diagnosis not present

## 2023-04-13 DIAGNOSIS — E538 Deficiency of other specified B group vitamins: Secondary | ICD-10-CM | POA: Diagnosis not present

## 2023-11-09 DIAGNOSIS — E559 Vitamin D deficiency, unspecified: Secondary | ICD-10-CM | POA: Diagnosis not present

## 2023-11-09 DIAGNOSIS — E538 Deficiency of other specified B group vitamins: Secondary | ICD-10-CM | POA: Diagnosis not present

## 2023-11-09 DIAGNOSIS — Z6829 Body mass index (BMI) 29.0-29.9, adult: Secondary | ICD-10-CM | POA: Diagnosis not present

## 2023-11-09 DIAGNOSIS — Z0000 Encounter for general adult medical examination without abnormal findings: Secondary | ICD-10-CM | POA: Diagnosis not present

## 2024-05-16 DIAGNOSIS — E559 Vitamin D deficiency, unspecified: Secondary | ICD-10-CM | POA: Diagnosis not present

## 2024-05-16 DIAGNOSIS — F32A Depression, unspecified: Secondary | ICD-10-CM | POA: Diagnosis not present

## 2024-05-16 DIAGNOSIS — Z79899 Other long term (current) drug therapy: Secondary | ICD-10-CM | POA: Diagnosis not present

## 2024-05-16 DIAGNOSIS — E538 Deficiency of other specified B group vitamins: Secondary | ICD-10-CM | POA: Diagnosis not present

## 2024-05-16 DIAGNOSIS — E785 Hyperlipidemia, unspecified: Secondary | ICD-10-CM | POA: Diagnosis not present

## 2024-07-05 DIAGNOSIS — Z6828 Body mass index (BMI) 28.0-28.9, adult: Secondary | ICD-10-CM | POA: Diagnosis not present

## 2024-07-05 DIAGNOSIS — F419 Anxiety disorder, unspecified: Secondary | ICD-10-CM | POA: Diagnosis not present

## 2024-07-05 DIAGNOSIS — L259 Unspecified contact dermatitis, unspecified cause: Secondary | ICD-10-CM | POA: Diagnosis not present

## 2024-07-17 DIAGNOSIS — F419 Anxiety disorder, unspecified: Secondary | ICD-10-CM | POA: Diagnosis not present

## 2024-08-18 DIAGNOSIS — F5104 Psychophysiologic insomnia: Secondary | ICD-10-CM | POA: Diagnosis not present

## 2024-08-18 DIAGNOSIS — F419 Anxiety disorder, unspecified: Secondary | ICD-10-CM | POA: Diagnosis not present
